# Patient Record
Sex: Female | Born: 1985 | Race: White | Hispanic: No | Marital: Single | Smoking: Never smoker
Health system: Southern US, Community
[De-identification: ages and names within clinical notes are randomized; demographics above are authoritative.]

---

## 2016-11-09 ENCOUNTER — Ambulatory Visit (INDEPENDENT_AMBULATORY_CARE_PROVIDER_SITE_OTHER): Payer: Worker's Compensation

## 2016-11-09 ENCOUNTER — Encounter: Payer: Self-pay | Admitting: Family Medicine

## 2016-11-09 ENCOUNTER — Ambulatory Visit (INDEPENDENT_AMBULATORY_CARE_PROVIDER_SITE_OTHER): Payer: Worker's Compensation | Admitting: Family Medicine

## 2016-11-09 VITALS — BP 138/84 | HR 92 | Temp 98.9°F | Resp 17 | Ht 67.0 in | Wt 307.0 lb

## 2016-11-09 DIAGNOSIS — S42141A Displaced fracture of glenoid cavity of scapula, right shoulder, initial encounter for closed fracture: Secondary | ICD-10-CM

## 2016-11-09 DIAGNOSIS — M25511 Pain in right shoulder: Secondary | ICD-10-CM | POA: Diagnosis not present

## 2016-11-09 DIAGNOSIS — S42151A Displaced fracture of neck of scapula, right shoulder, initial encounter for closed fracture: Secondary | ICD-10-CM

## 2016-11-09 MED ORDER — IBUPROFEN 800 MG PO TABS: 800.0000 mg | ORAL_TABLET | Freq: Three times a day (TID) | ORAL | 0 refills | Status: AC | PRN

## 2016-11-09 NOTE — Patient Instructions (Addendum)
  Okay to continue sling as needed for discomfort, gentle range of motion only as tolerated, but hold off on lifting weights for now.  Okay to continue ibuprofen 800 mg every 8 hours with food. Tylenol can also be taken in addition if needed. There was a possible small abnormal area on your x-ray and we'll refer you to orthopedics to decide if advanced imaging such as MRI may be needed. Return to the clinic or go to the nearest emergency room if any of your symptoms worsen or new symptoms occur.   Shoulder Pain Many things can cause shoulder pain, including:  An injury to the area.  Overuse of the shoulder.  Arthritis.  The source of the pain can be:  Inflammation.  An injury to the shoulder joint.  An injury to a tendon, ligament, or bone.  Follow these instructions at home: Take these actions to help with your pain:  Squeeze a soft ball or a foam pad as much as possible. This helps to keep the shoulder from swelling. It also helps to strengthen the arm.  Take over-the-counter and prescription medicines only as told by your health care provider.  If directed, apply ice to the area: ? Put ice in a plastic bag. ? Place a towel between your skin and the bag. ? Leave the ice on for 20 minutes, 2-3 times per day. Stop applying ice if it does not help with the pain.  If you were given a shoulder sling or immobilizer: ? Wear it as told. ? Remove it to shower or bathe. ? Move your arm as little as possible, but keep your hand moving to prevent swelling.  Contact a health care provider if:  Your pain gets worse.  Your pain is not relieved with medicines.  New pain develops in your arm, hand, or fingers. Get help right away if:  Your arm, hand, or fingers: ? Tingle. ? Become numb. ? Become swollen. ? Become painful. ? Turn white or blue. This information is not intended to replace advice given to you by your health care provider. Make sure you discuss any questions you have  with your health care provider. Document Released: 11/11/2004 Document Revised: 09/28/2015 Document Reviewed: 05/27/2014 Elsevier Interactive Patient Education  2017 [employer].     IF you received an x-ray today, you will receive an invoice from [facility]. Please contact [facility] at [phone] with questions or concerns regarding your invoice.   IF you received labwork today, you will receive an invoice from [place]. Please contact [facility] at [phone] with questions or concerns regarding your invoice.   Our billing staff will not be able to assist you with questions regarding bills from these companies.  You will be contacted with the lab results as soon as they are available. The fastest way to get your results is to activate your My Chart account. Instructions are located on the last page of this paperwork. If you have not heard from [place] regarding the results in 2 weeks, please contact this office.
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
## 2018-05-23 IMAGING — DX DG SHOULDER 2+V*R*
3 series · 3 of 3 positions shown · non-contrast
Comparison: No recent prior .

CLINICAL DATA: Right shoulder pain.  Possible injury .

EXAM:
RIGHT SHOULDER - 2+ VIEW

[shoulder ap]
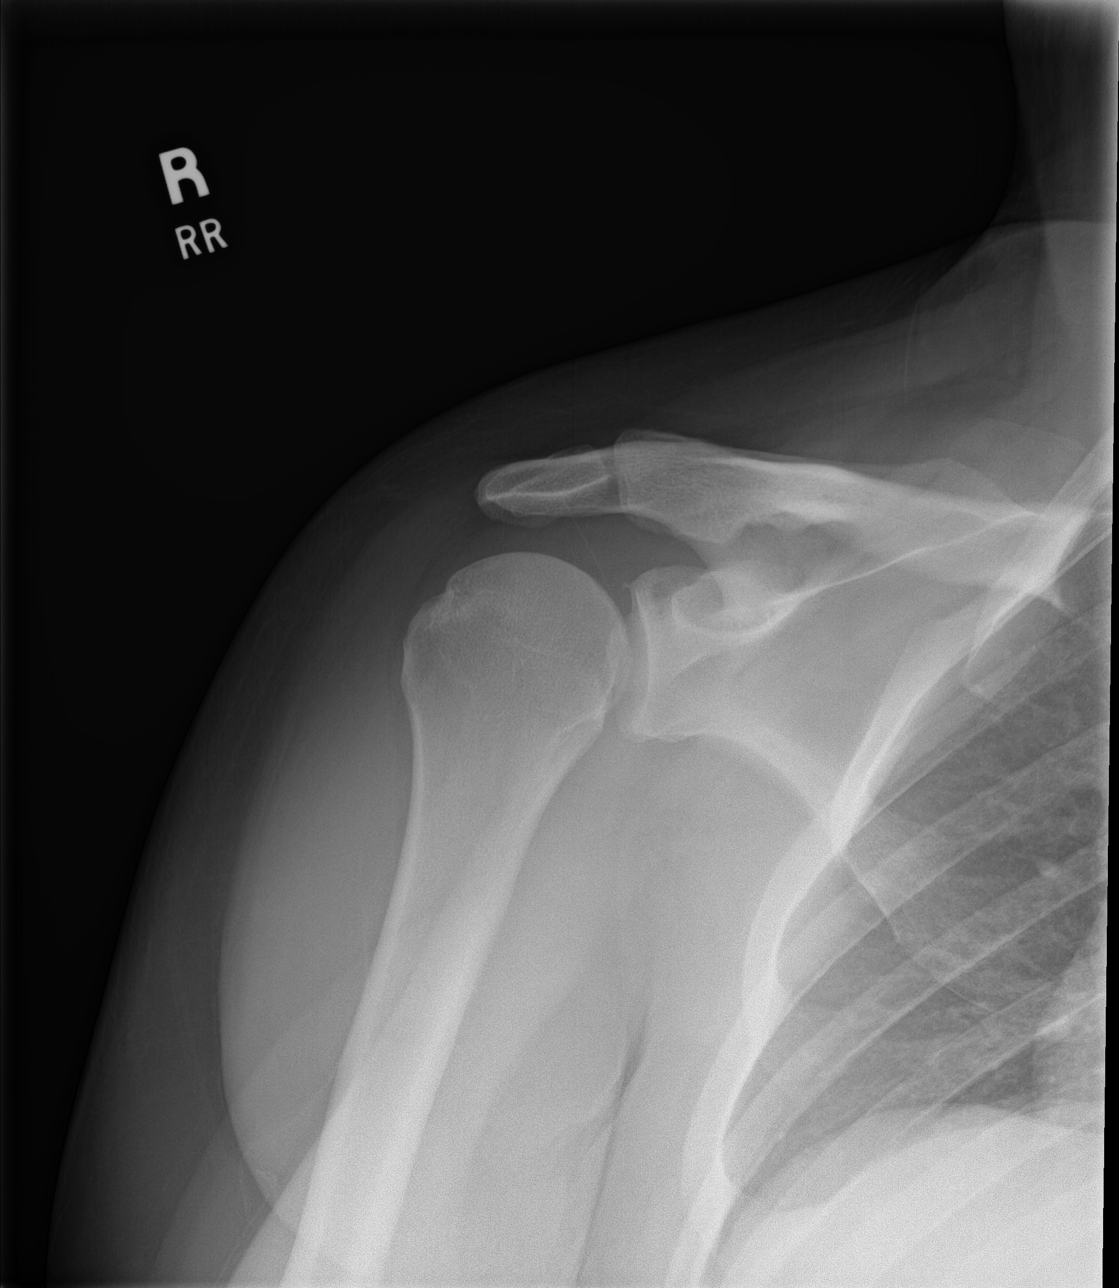

[shoulder y-view]
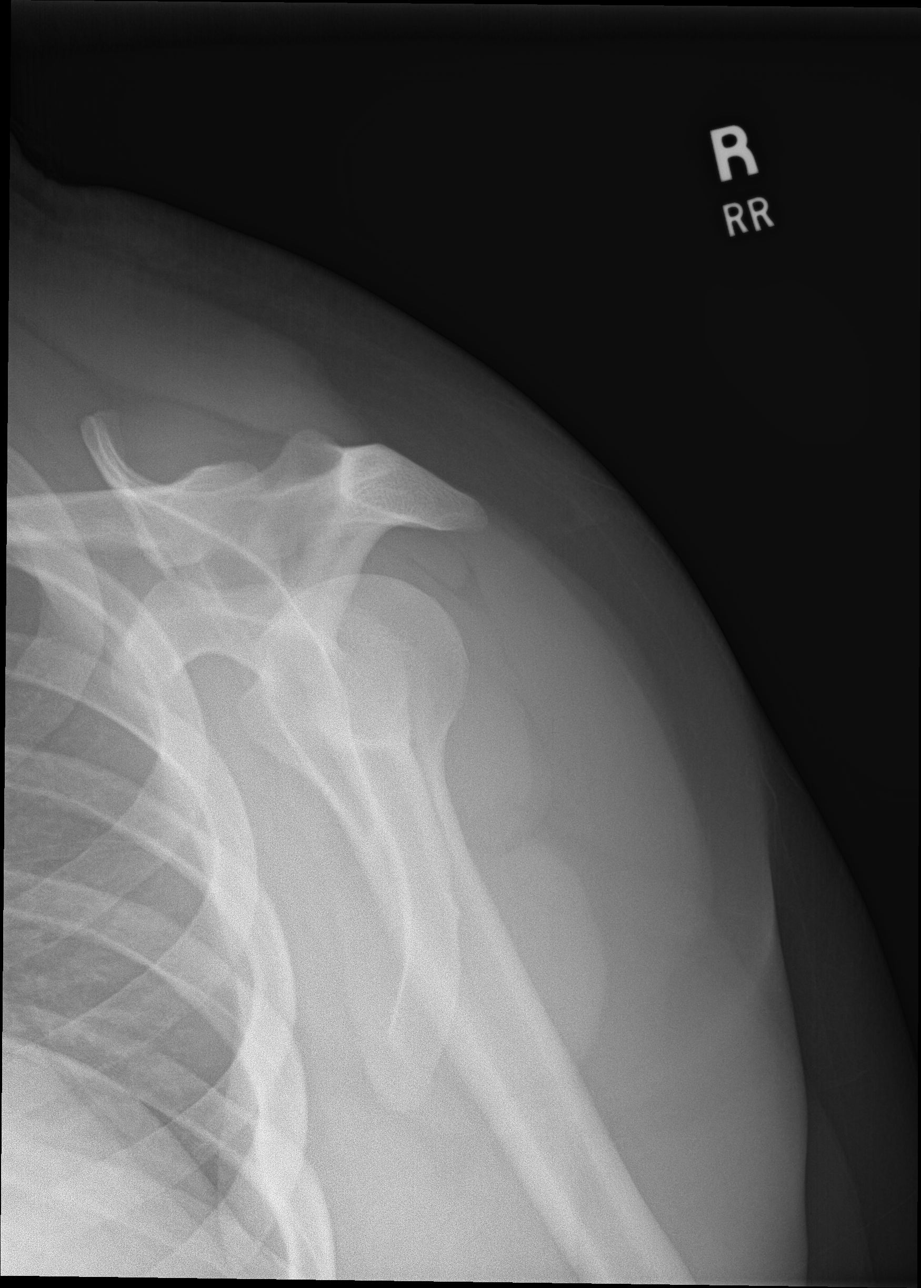

[shoulder axial]
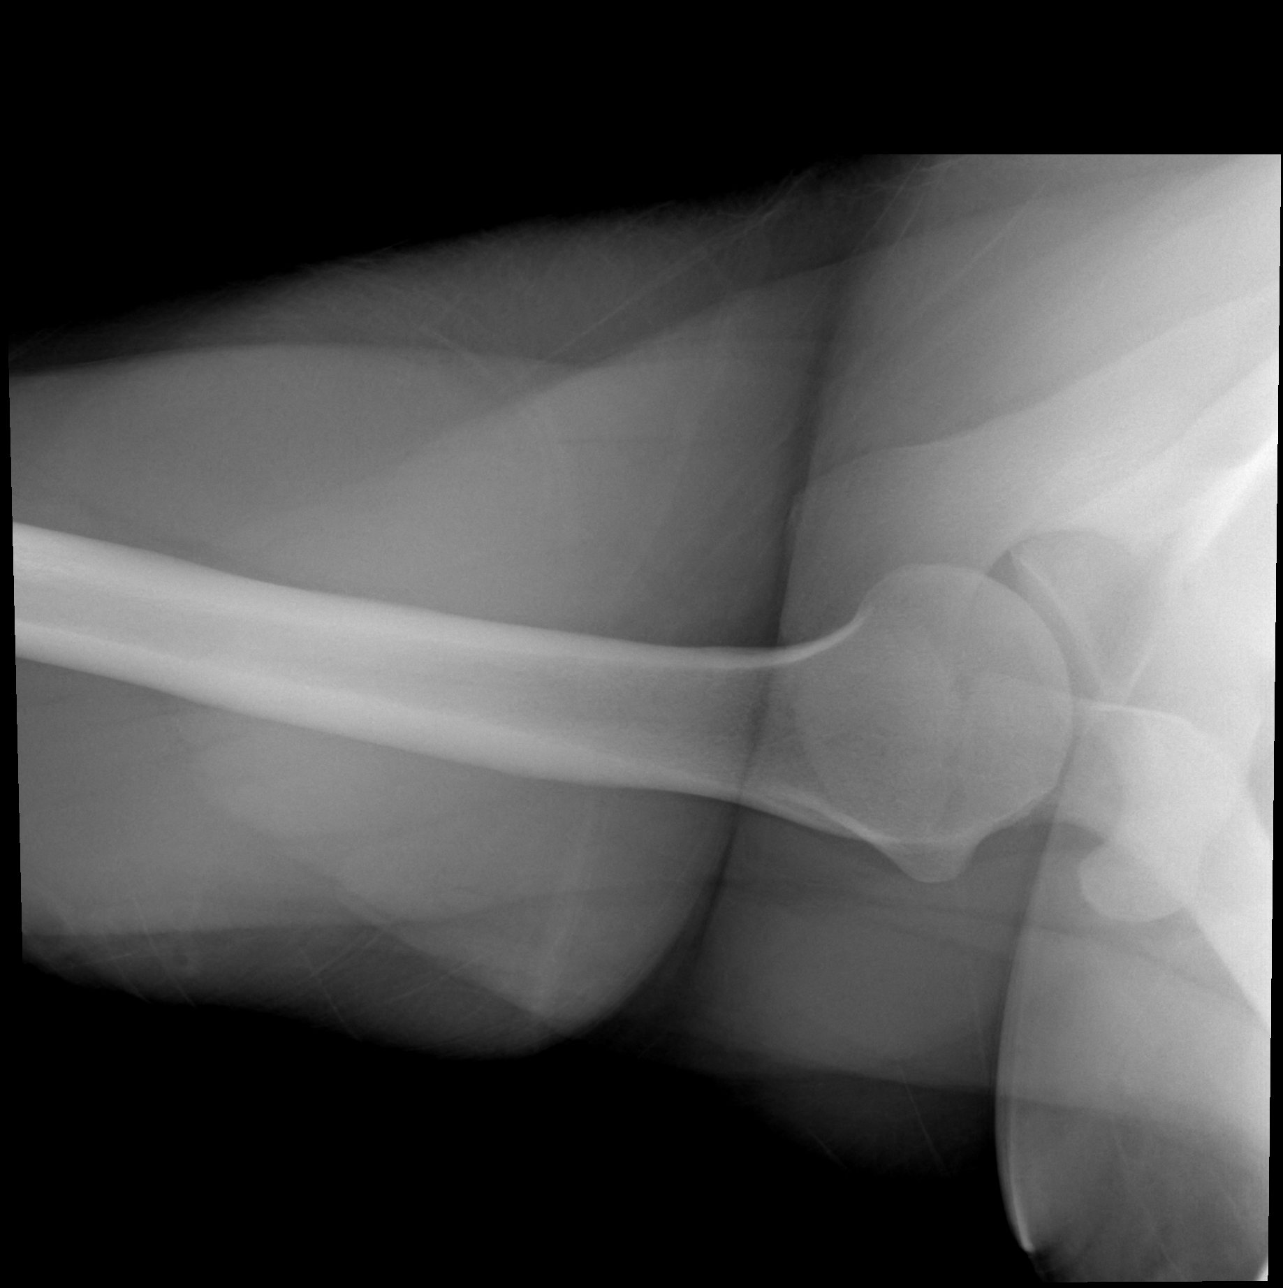

[3 of 3 positions shown; findings below may reference images not displayed]

FINDINGS: Tiny bony density is noted along the superior aspect of the glenoid
rim. This may represent a tiny fracture fragment or loose body. Mild
glenohumeral degenerative change. No evidence of dislocation or
separation .
IMPRESSION: Tiny bony density is noted along the superior aspect of the glenoid
rim. This may represent tiny fracture fragment or loose body. Mild
glenohumeral degenerative change.
